# Patient Record
Sex: Female | Born: 1985 | Race: White | Hispanic: No | Marital: Single | Smoking: Former smoker
Health system: Southern US, Community
[De-identification: ages and names within clinical notes are randomized; demographics above are authoritative.]

## PROBLEM LIST (undated history)

## (undated) DIAGNOSIS — J45909 Unspecified asthma, uncomplicated: Secondary | ICD-10-CM

## (undated) DIAGNOSIS — R87629 Unspecified abnormal cytological findings in specimens from vagina: Secondary | ICD-10-CM

## (undated) DIAGNOSIS — L509 Urticaria, unspecified: Secondary | ICD-10-CM

## (undated) DIAGNOSIS — B009 Herpesviral infection, unspecified: Secondary | ICD-10-CM

## (undated) HISTORY — PX: LEEP: SHX91

## (undated) HISTORY — DX: Unspecified asthma, uncomplicated: J45.909

## (undated) HISTORY — DX: Urticaria, unspecified: L50.9

---
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
## 2008-10-26 ENCOUNTER — Ambulatory Visit (HOSPITAL_COMMUNITY): Admission: RE | Admit: 2008-10-26 | Discharge: 2008-10-26 | Payer: Self-pay | Admitting: Obstetrics and Gynecology

## 2008-10-26 ENCOUNTER — Encounter (INDEPENDENT_AMBULATORY_CARE_PROVIDER_SITE_OTHER): Payer: Self-pay | Admitting: Obstetrics and Gynecology

## 2009-06-01 ENCOUNTER — Inpatient Hospital Stay (HOSPITAL_COMMUNITY): Admission: AD | Admit: 2009-06-01 | Discharge: 2009-06-01 | Payer: Self-pay | Admitting: Obstetrics & Gynecology

## 2010-11-13 LAB — GC/CHLAMYDIA PROBE AMP, GENITAL
Chlamydia, DNA Probe: POSITIVE — AB
GC Probe Amp, Genital: NEGATIVE

## 2010-11-20 LAB — PREGNANCY, URINE: Preg Test, Ur: NEGATIVE

## 2010-11-20 LAB — CBC
HCT: 36.3 % (ref 36.0–46.0)
Hemoglobin: 12.5 g/dL (ref 12.0–15.0)
WBC: 6.6 10*3/uL (ref 4.0–10.5)

## 2010-11-20 LAB — URINALYSIS, ROUTINE W REFLEX MICROSCOPIC
Nitrite: NEGATIVE
Protein, ur: NEGATIVE mg/dL
Specific Gravity, Urine: >1.03 — ABNORMAL HIGH (ref 1.005–1.030)
Urobilinogen, UA: 0.2 mg/dL (ref 0.0–1.0)

## 2010-11-20 LAB — APTT: aPTT: 32 seconds (ref 24–37)

## 2010-11-20 LAB — PROTIME-INR: INR: 1.1 (ref 0.00–1.49)

## 2010-12-23 NOTE — Op Note (Signed)
NAME:  Alicia Murphy, Alicia Murphy                ACCOUNT NO.:  [id number]   MEDICAL RECORD NO.:  [id number]          PATIENT TYPE:  AMB   LOCATION:  SDC                           FACILITY:  [facility]   PHYSICIAN:  Miguel Aschoff, M.D.       DATE OF BIRTH:  10/24/1985   DATE OF PROCEDURE:  10/26/2008  DATE OF DISCHARGE:                               OPERATIVE REPORT   PREOPERATIVE DIAGNOSIS:  High-grade intraepithelial cervical neoplasia.   POSTOPERATIVE DIAGNOSIS:  High-grade intraepithelial cervical neoplasia.   PROCEDURE:  Cone biopsy endocervical curettage.   SURGEON:  Miguel Aschoff, MD   ANESTHESIA:  General.   COMPLICATIONS:  None.   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  The patient underwent colposcopy  with colposcopic-directed biopsies, the biopsy report came back  revealing high-grade cervical intraepithelial neoplasia with extension  into the endocervical glands.  Because of this, she is being taken to  the operating room at this time to undergo a cone biopsy in an effort to  excise the lesion to ensure that a more significant lesion does not  exist.  The risks and benefits of the procedure were discussed with the  patient.  Informed consent has been obtained.   PROCEDURE:  The patient was taken to the operating room, placed in  supine position.  IV sedation was administered without difficulty.  She  was then placed in dorsal lithotomy position, prepped and draped in the  usual sterile fashion.  After this was done, weighted speculum was  placed in the vaginal vault.  The anterior cervical lip was grasped with  a tenaculum and at this point 2 sutures of 0-chromic were placed from  the 2 o'clock to 4 o'clock positions and the 8 o'clock to 10 o'clock  positions in an effort to occlude the descending branch of cervical  artery.  These were tied and held.  After this was done, the cervix was  stained with Lugol solution and  obvious area with poor Lugol uptake was  identified.  Once this was done, the cervix was injected with a total of  14 mL of 1% Xylocaine with epinephrine 1:100,000.  Following this, a  cone of tissue was cut excising all areas of poor Lugol uptake and  ensuring that there was a margin about the lesion.  The lesion was then  cut free by cutting to the endocervical canal.  The cone of tissue was  removed intact at 12 o'clock position with a chromic suture.  The  residual portion of the endocervical canal was then curetted with  debulking curette.  This was sent as separate specimen.  Following this  the bed of the cone biopsy set was cauterized with electrocautery.  With  good hemostasis being achieved, a Gelfoam pack was placed into the  defect and the previous placed sutures were tied across the cervix to  hold Gelfoam in place.  The estimated blood loss from the procedure was  approximately 5 mL.  The patient tolerated the procedure well and went  to the recovery room in satisfactory condition.   Plan is for the patient to be discharged home.   Medications for home include Darvocet N 100 one every 4 hours as needed  for pain, doxycycline 1 twice a day x3 days.   The patient is instructed to place nothing in the vagina for 4 weeks, to  call for any problems such as fever, pain, or heavy bleeding.  She will  be seen back in 4 weeks for followup examination.      Miguel Aschoff, M.D.  Electronically Signed     AR/MEDQ  D:  10/26/2008  T:  10/26/2008  Job:  [id number]

## 2012-10-27 ENCOUNTER — Other Ambulatory Visit: Payer: Self-pay | Admitting: Obstetrics and Gynecology

## 2014-10-17 ENCOUNTER — Other Ambulatory Visit: Payer: Self-pay | Admitting: Obstetrics and Gynecology

## 2014-10-18 LAB — CYTOLOGY - PAP

## 2016-01-14 ENCOUNTER — Other Ambulatory Visit: Payer: Self-pay | Admitting: Obstetrics & Gynecology

## 2016-01-15 LAB — CYTOLOGY - PAP

## 2016-02-10 LAB — OB RESULTS CONSOLE HIV ANTIBODY (ROUTINE TESTING): HIV: NONREACTIVE

## 2016-02-10 LAB — OB RESULTS CONSOLE GC/CHLAMYDIA
CHLAMYDIA, DNA PROBE: NEGATIVE
GC PROBE AMP, GENITAL: NEGATIVE

## 2016-02-10 LAB — OB RESULTS CONSOLE HEPATITIS B SURFACE ANTIGEN: HEP B S AG: NEGATIVE

## 2016-02-10 LAB — OB RESULTS CONSOLE ABO/RH: RH Type: POSITIVE

## 2016-02-10 LAB — OB RESULTS CONSOLE RUBELLA ANTIBODY, IGM: RUBELLA: IMMUNE

## 2016-02-10 LAB — OB RESULTS CONSOLE ANTIBODY SCREEN: ANTIBODY SCREEN: NEGATIVE

## 2016-02-10 LAB — OB RESULTS CONSOLE RPR: RPR: NONREACTIVE

## 2016-07-27 ENCOUNTER — Other Ambulatory Visit (HOSPITAL_COMMUNITY): Payer: Self-pay | Admitting: Obstetrics and Gynecology

## 2016-07-27 DIAGNOSIS — O283 Abnormal ultrasonic finding on antenatal screening of mother: Secondary | ICD-10-CM

## 2016-07-27 DIAGNOSIS — Z3A36 36 weeks gestation of pregnancy: Secondary | ICD-10-CM

## 2016-07-27 DIAGNOSIS — N1330 Unspecified hydronephrosis: Secondary | ICD-10-CM

## 2016-07-28 ENCOUNTER — Encounter (HOSPITAL_COMMUNITY): Payer: Self-pay | Admitting: *Deleted

## 2016-07-28 ENCOUNTER — Ambulatory Visit (HOSPITAL_COMMUNITY)
Admission: RE | Admit: 2016-07-28 | Discharge: 2016-07-28 | Disposition: A | Discharge status: Home / Self Care | Payer: Self-pay | Source: Ambulatory Visit | Attending: Obstetrics and Gynecology | Admitting: Obstetrics and Gynecology

## 2016-07-28 DIAGNOSIS — O283 Abnormal ultrasonic finding on antenatal screening of mother: Secondary | ICD-10-CM

## 2016-07-28 DIAGNOSIS — Z3A36 36 weeks gestation of pregnancy: Secondary | ICD-10-CM | POA: Insufficient documentation

## 2016-07-28 DIAGNOSIS — N1330 Unspecified hydronephrosis: Secondary | ICD-10-CM

## 2016-07-28 DIAGNOSIS — O358XX0 Maternal care for other (suspected) fetal abnormality and damage, not applicable or unspecified: Secondary | ICD-10-CM | POA: Insufficient documentation

## 2016-07-28 DIAGNOSIS — Z363 Encounter for antenatal screening for malformations: Secondary | ICD-10-CM | POA: Insufficient documentation

## 2016-07-28 HISTORY — DX: Herpesviral infection, unspecified: B00.9

## 2016-07-28 HISTORY — DX: Unspecified abnormal cytological findings in specimens from vagina: R87.629

## 2016-08-10 NOTE — L&D Delivery Note (Addendum)
Delivery Note At 8:19 PM a viable and healthy female was delivered via  (Presentation: Left Occiput; Anterior ).  APGAR: 8, 9; weight 10lb, 2.8oz .  The anterior shoulder delivered easily after the head was delivered. Placenta status: Spontaneous ,Intact .  Cord: 3V  with a loose nuchal x 1  Anesthesia:  Epidural Episiotomy:  None Lacerations:  None Suture Repair: NA Est. Blood Loss (mL):  350 cc  Mom to postpartum.  Baby to Couplet care / Skin to Skin.  Alicia Murphy H. 08/20/2016, 8:55 PM

## 2016-08-14 ENCOUNTER — Other Ambulatory Visit (HOSPITAL_COMMUNITY): Payer: Self-pay

## 2016-08-14 ENCOUNTER — Encounter (HOSPITAL_COMMUNITY): Payer: Self-pay

## 2016-08-19 ENCOUNTER — Encounter (HOSPITAL_COMMUNITY): Payer: Self-pay | Admitting: *Deleted

## 2016-08-19 ENCOUNTER — Inpatient Hospital Stay (HOSPITAL_COMMUNITY): Payer: BLUE CROSS/BLUE SHIELD | Admitting: Anesthesiology

## 2016-08-19 ENCOUNTER — Inpatient Hospital Stay (HOSPITAL_COMMUNITY)
Admission: AD | Admit: 2016-08-19 | Discharge: 2016-08-22 | DRG: 774 | Disposition: A | Discharge status: Home / Self Care | Payer: BLUE CROSS/BLUE SHIELD | Source: Ambulatory Visit | Attending: Obstetrics and Gynecology | Admitting: Obstetrics and Gynecology

## 2016-08-19 DIAGNOSIS — O1493 Unspecified pre-eclampsia, third trimester: Secondary | ICD-10-CM | POA: Diagnosis present

## 2016-08-19 DIAGNOSIS — Z3A39 39 weeks gestation of pregnancy: Secondary | ICD-10-CM

## 2016-08-19 DIAGNOSIS — O1494 Unspecified pre-eclampsia, complicating childbirth: Secondary | ICD-10-CM | POA: Diagnosis present

## 2016-08-19 DIAGNOSIS — A6000 Herpesviral infection of urogenital system, unspecified: Secondary | ICD-10-CM | POA: Diagnosis present

## 2016-08-19 DIAGNOSIS — O134 Gestational [pregnancy-induced] hypertension without significant proteinuria, complicating childbirth: Principal | ICD-10-CM | POA: Diagnosis present

## 2016-08-19 DIAGNOSIS — O9832 Other infections with a predominantly sexual mode of transmission complicating childbirth: Secondary | ICD-10-CM | POA: Diagnosis present

## 2016-08-19 LAB — URINALYSIS, ROUTINE W REFLEX MICROSCOPIC
Bilirubin Urine: NEGATIVE
Glucose, UA: NEGATIVE mg/dL
Hgb urine dipstick: NEGATIVE
Ketones, ur: NEGATIVE mg/dL
Nitrite: NEGATIVE
PH: 5 (ref 5.0–8.0)
Protein, ur: 100 mg/dL — AB
Specific Gravity, Urine: 1.024 (ref 1.005–1.030)

## 2016-08-19 LAB — CBC
HEMATOCRIT: 35.1 % — AB (ref 36.0–46.0)
HEMATOCRIT: 36.2 % (ref 36.0–46.0)
Hemoglobin: 12.2 g/dL (ref 12.0–15.0)
Hemoglobin: 12.3 g/dL (ref 12.0–15.0)
MCH: 33.1 pg (ref 26.0–34.0)
MCH: 32.7 pg (ref 26.0–34.0)
MCHC: 34.8 g/dL (ref 30.0–36.0)
MCHC: 34 g/dL (ref 30.0–36.0)
MCV: 95.1 fL (ref 78.0–100.0)
MCV: 96.3 fL (ref 78.0–100.0)
PLATELETS: 135 10*3/uL — AB (ref 150–400)
Platelets: 132 10*3/uL — ABNORMAL LOW (ref 150–400)
RBC: 3.69 MIL/uL — AB (ref 3.87–5.11)
RBC: 3.76 MIL/uL — AB (ref 3.87–5.11)
RDW: 13.9 % (ref 11.5–15.5)
RDW: 14.1 % (ref 11.5–15.5)
WBC: 8.2 10*3/uL (ref 4.0–10.5)
WBC: 8.1 10*3/uL (ref 4.0–10.5)

## 2016-08-19 LAB — COMPREHENSIVE METABOLIC PANEL
ALT: 12 U/L — AB (ref 14–54)
ANION GAP: 7 (ref 5–15)
AST: 19 U/L (ref 15–41)
Albumin: 2.9 g/dL — ABNORMAL LOW (ref 3.5–5.0)
Alkaline Phosphatase: 122 U/L (ref 38–126)
BILIRUBIN TOTAL: 0.3 mg/dL (ref 0.3–1.2)
BUN: 15 mg/dL (ref 6–20)
CO2: 21 mmol/L — AB (ref 22–32)
CREATININE: 0.8 mg/dL (ref 0.44–1.00)
Calcium: 8.9 mg/dL (ref 8.9–10.3)
Chloride: 108 mmol/L (ref 101–111)
GFR calc non Af Amer: >60 mL/min (ref >60)
GLUCOSE: 86 mg/dL (ref 65–99)
Potassium: 3.9 mmol/L (ref 3.5–5.1)
SODIUM: 136 mmol/L (ref 135–145)
TOTAL PROTEIN: 6.5 g/dL (ref 6.5–8.1)

## 2016-08-19 LAB — PROTEIN / CREATININE RATIO, URINE
Creatinine, Urine: 171 mg/dL
Protein Creatinine Ratio: 0.37 mg/mg{Cre} — ABNORMAL HIGH (ref 0.00–0.15)
Total Protein, Urine: 63 mg/dL

## 2016-08-19 LAB — OB RESULTS CONSOLE GBS: GBS: NEGATIVE

## 2016-08-19 LAB — TYPE AND SCREEN
ABO/RH(D): O POS
Antibody Screen: NEGATIVE

## 2016-08-19 MED ORDER — PHENYLEPHRINE 40 MCG/ML (10ML) SYRINGE FOR IV PUSH (FOR BLOOD PRESSURE SUPPORT)
80.0000 ug | PREFILLED_SYRINGE | INTRAVENOUS | Status: DC | PRN
  Filled 2016-08-19: qty 10

## 2016-08-19 MED ORDER — LACTATED RINGERS IV SOLN
INTRAVENOUS | Status: DC
  Administered 2016-08-19 – 2016-08-20 (×2): via INTRAVENOUS

## 2016-08-19 MED ORDER — SOD CITRATE-CITRIC ACID 500-334 MG/5ML PO SOLN: 30.0000 mL | ORAL | Status: DC | PRN

## 2016-08-19 MED ORDER — FAMOTIDINE 20 MG PO TABS
20.0000 mg | ORAL_TABLET | Freq: Two times a day (BID) | ORAL | Status: DC | PRN
  Administered 2016-08-19 – 2016-08-20 (×3): 20 mg via ORAL
  Filled 2016-08-19 (×3): qty 1

## 2016-08-19 MED ORDER — OXYTOCIN 40 UNITS IN LACTATED RINGERS INFUSION - SIMPLE MED
2.5000 [IU]/h | INTRAVENOUS | Status: DC
Start: 2016-08-19 — End: 2016-08-20
  Administered 2016-08-20: 2.5 [IU]/h via INTRAVENOUS
  Filled 2016-08-19: qty 1000

## 2016-08-19 MED ORDER — LACTATED RINGERS IV SOLN: 500.0000 mL | INTRAVENOUS | Status: DC | PRN

## 2016-08-19 MED ORDER — EPHEDRINE 5 MG/ML INJ: 10.0000 mg | INTRAVENOUS | Status: DC | PRN

## 2016-08-19 MED ORDER — OXYTOCIN BOLUS FROM INFUSION
500.0000 mL | Freq: Once | INTRAVENOUS | Status: AC
  Administered 2016-08-20: 500 mL via INTRAVENOUS

## 2016-08-19 MED ORDER — ACETAMINOPHEN 325 MG PO TABS: 650.0000 mg | ORAL_TABLET | ORAL | Status: DC | PRN

## 2016-08-19 MED ORDER — LIDOCAINE HCL (PF) 1 % IJ SOLN
INTRAMUSCULAR | Status: DC | PRN
  Administered 2016-08-19 (×2): 5 mL

## 2016-08-19 MED ORDER — ONDANSETRON HCL 4 MG/2ML IJ SOLN
4.0000 mg | Freq: Four times a day (QID) | INTRAMUSCULAR | Status: DC | PRN
  Administered 2016-08-20: 4 mg via INTRAVENOUS
  Filled 2016-08-19: qty 2

## 2016-08-19 MED ORDER — LACTATED RINGERS IV SOLN: 500.0000 mL | Freq: Once | INTRAVENOUS | Status: DC

## 2016-08-19 MED ORDER — OXYTOCIN 40 UNITS IN LACTATED RINGERS INFUSION - SIMPLE MED
1.0000 m[IU]/min | INTRAVENOUS | Status: DC
  Administered 2016-08-19: 2 m[IU]/min via INTRAVENOUS
  Filled 2016-08-19: qty 1000

## 2016-08-19 MED ORDER — PHENYLEPHRINE 40 MCG/ML (10ML) SYRINGE FOR IV PUSH (FOR BLOOD PRESSURE SUPPORT): 80.0000 ug | PREFILLED_SYRINGE | INTRAVENOUS | Status: DC | PRN

## 2016-08-19 MED ORDER — TERBUTALINE SULFATE 1 MG/ML IJ SOLN: 0.2500 mg | Freq: Once | INTRAMUSCULAR | Status: DC | PRN

## 2016-08-19 MED ORDER — FENTANYL 2.5 MCG/ML BUPIVACAINE 1/10 % EPIDURAL INFUSION (WH - ANES)
14.0000 mL/h | INTRAMUSCULAR | Status: DC | PRN
  Administered 2016-08-19 – 2016-08-20 (×4): 14 mL/h via EPIDURAL
  Filled 2016-08-19 (×4): qty 100

## 2016-08-19 MED ORDER — OXYCODONE-ACETAMINOPHEN 5-325 MG PO TABS: 1.0000 | ORAL_TABLET | ORAL | Status: DC | PRN

## 2016-08-19 MED ORDER — DIPHENHYDRAMINE HCL 50 MG/ML IJ SOLN: 12.5000 mg | INTRAMUSCULAR | Status: DC | PRN

## 2016-08-19 MED ORDER — OXYCODONE-ACETAMINOPHEN 5-325 MG PO TABS: 2.0000 | ORAL_TABLET | ORAL | Status: DC | PRN

## 2016-08-19 MED ORDER — LIDOCAINE HCL (PF) 1 % IJ SOLN
30.0000 mL | INTRAMUSCULAR | Status: DC | PRN
  Filled 2016-08-19: qty 30

## 2016-08-19 NOTE — MAU Provider Note (Signed)
Chief Complaint:  Hypertension   First Provider Initiated Contact with Patient 08/19/16 1804      HPI: Alicia Murphy is a 31 y.o. G1P0 at 56w2dwho presents to maternity admissions sent from the office for elevated BP for the first time today.  She denies h/a or visual disturbances but does report bilateral rib pain in her upper abdomen x 1 week. She has not tried any treatments for BP or pain.  Her pain is intermittent and unchanged since onset.  She is taking her Valtrex as prescribed for hx of genital herpes and denies any prodromal symptoms or signs of active outbreak. She reports good fetal movement, denies regular contractions, LOF, vaginal bleeding, vaginal itching/burning, urinary symptoms, dizziness, n/v, or fever/chills.    HPI  Past Medical History: Past Medical History:  Diagnosis Date  . HSV infection   . Vaginal Pap smear, abnormal     Past obstetric history: OB History  Gravida Para Term Preterm AB Living  1         0  SAB TAB Ectopic Multiple Live Births               # Outcome Date GA Lbr Len/2nd Weight Sex Delivery Anes PTL Lv  1 Current               Past Surgical History: Past Surgical History:  Procedure Laterality Date  . LEEP      Family History: History reviewed. No pertinent family history.  Social History: Social History  Substance Use Topics  . Smoking status: Never Smoker  . Smokeless tobacco: Never Used  . Alcohol use No    Allergies: No Known Allergies  Meds:  Prescriptions Prior to Admission  Medication Sig Dispense Refill Last Dose  . diphenhydrAMINE (BENADRYL) 25 mg capsule Take 25 mg by mouth every 6 (six) hours as needed for sleep.    Past Month at Unknown time  . Prenatal Vit w/Fe-Methylfol-FA (PNV PO) Take 1 tablet by mouth every morning.    08/19/2016 at Unknown time  . ranitidine (ZANTAC) 150 MG capsule Take 150 mg by mouth 2 (two) times daily.   08/19/2016 at Unknown time  . Tetrahydrozoline HCl (VISINE OP) Apply 1 drop to eye  as needed (itchy eyes).   08/18/2016 at Unknown time  . valACYclovir (VALTREX) 500 MG tablet Take 500 mg by mouth 2 (two) times daily.   08/19/2016 at Unknown time    ROS:  Review of Systems  Constitutional: Negative for chills, fatigue and fever.  Eyes: Negative for visual disturbance.  Respiratory: Negative for shortness of breath.   Cardiovascular: Negative for chest pain.  Gastrointestinal: Negative for abdominal pain, nausea and vomiting.  Genitourinary: Negative for difficulty urinating, dysuria, flank pain, pelvic pain, vaginal bleeding, vaginal discharge and vaginal pain.  Neurological: Negative for dizziness and headaches.  Psychiatric/Behavioral: Negative.      I have reviewed patient's Past Medical Hx, Surgical Hx, Family Hx, Social Hx, medications and allergies.   Physical Exam   Patient Vitals for the past 24 hrs:  BP Temp Temp src Pulse Resp  08/19/16 1817 147/86 - - 77 -  08/19/16 1804 145/89 - - 82 -  08/19/16 1747 151/89 - - 78 -  08/19/16 1742 147/99 - - 89 -  08/19/16 1705 142/99 98.1 F (36.7 C) Oral 87 18   Constitutional: Well-developed, well-nourished female in no acute distress.  HEART: normal rate, heart sounds, regular rhythm RESP: normal effort, lung sounds clear and equal bilaterally  GI: Abd soft, non-tender, gravid appropriate for gestational age.  MS: Extremities nontender, no edema, normal ROM Neurologic: Alert and oriented x 4.  GU: Neg CVAT.    2/90/-2, soft, anterior, vertex by Fatima Blank, CNM No clinical evidence of HSV lesions on SSE  FHT:  Baseline 140 , moderate variability, accelerations present, no decelerations Contractions: None on toco or to palpation   Labs: Results for orders placed or performed during the hospital encounter of 08/19/16 (from the past 24 hour(s))  Protein / creatinine ratio, urine     Status: Abnormal   Collection Time: 08/19/16  5:08 PM  Result Value Ref Range   Creatinine, Urine 171.00 mg/dL    Total Protein, Urine 63 mg/dL   Protein Creatinine Ratio 0.37 (H) 0.00 - 0.15 mg/mg[Cre]  Urinalysis, Routine w reflex microscopic     Status: Abnormal   Collection Time: 08/19/16  5:08 PM  Result Value Ref Range   Color, Urine YELLOW YELLOW   APPearance CLOUDY (A) CLEAR   Specific Gravity, Urine 1.024 1.005 - 1.030   pH 5.0 5.0 - 8.0   Glucose, UA NEGATIVE NEGATIVE mg/dL   Hgb urine dipstick NEGATIVE NEGATIVE   Bilirubin Urine NEGATIVE NEGATIVE   Ketones, ur NEGATIVE NEGATIVE mg/dL   Protein, ur 100 (A) NEGATIVE mg/dL   Nitrite NEGATIVE NEGATIVE   Leukocytes, UA TRACE (A) NEGATIVE   RBC / HPF 0-5 0 - 5 RBC/hpf   WBC, UA 0-5 0 - 5 WBC/hpf   Bacteria, UA RARE (A) NONE SEEN   Squamous Epithelial / LPF TOO NUMEROUS TO COUNT (A) NONE SEEN   Mucous PRESENT   CBC     Status: Abnormal   Collection Time: 08/19/16  5:52 PM  Result Value Ref Range   WBC 8.2 4.0 - 10.5 K/uL   RBC 3.69 (L) 3.87 - 5.11 MIL/uL   Hemoglobin 12.2 12.0 - 15.0 g/dL   HCT 35.1 (L) 36.0 - 46.0 %   MCV 95.1 78.0 - 100.0 fL   MCH 33.1 26.0 - 34.0 pg   MCHC 34.8 30.0 - 36.0 g/dL   RDW 13.9 11.5 - 15.5 %   Platelets 132 (L) 150 - 400 K/uL  Comprehensive metabolic panel     Status: Abnormal   Collection Time: 08/19/16  5:52 PM  Result Value Ref Range   Sodium 136 135 - 145 mmol/L   Potassium 3.9 3.5 - 5.1 mmol/L   Chloride 108 101 - 111 mmol/L   CO2 21 (L) 22 - 32 mmol/L   Glucose, Bld 86 65 - 99 mg/dL   BUN 15 6 - 20 mg/dL   Creatinine, Ser 0.80 0.44 - 1.00 mg/dL   Calcium 8.9 8.9 - 10.3 mg/dL   Total Protein 6.5 6.5 - 8.1 g/dL   Albumin 2.9 (L) 3.5 - 5.0 g/dL   AST 19 15 - 41 U/L   ALT 12 (L) 14 - 54 U/L   Alkaline Phosphatase 122 38 - 126 U/L   Total Bilirubin 0.3 0.3 - 1.2 mg/dL   GFR calc non Af Amer >60 >60 mL/min   GFR calc Af Amer >60 >60 mL/min   Anion gap 7 5 - 15      Imaging:  [place] Mfm Ob Detail +14 Wk  Result Date: 07/28/2016 OBSTETRICAL ULTRASOUND: This exam was performed within a [facility] Ultrasound Department. The OB US report was generated in the AS system, and faxed to the ordering physician.  This report is available in the [facility]  PACS. See the AS Obstetric US report via the Image Link.   MAU Course/MDM: I have ordered labs and reviewed results.  NST reviewed Consult Dr Rogue Bussing with presentation, exam findings and test results.  Proteinuria with consistently elevated BP c/w preeclampsia without severe features Plan IOL for preeclampsia  Pt stable at time of transfer   Assessment: 1. Preeclampsia, third trimester   2. GBS negative 3.  Hx genital HSV, on Valtrex, no evidence of active outbreak  Plan: Admit to Spiro to start at 2 milliunits/min, increase by 2 per protocol Pt may have epidural when desired Anticipate NSVD   Fatima Blank Certified Nurse-Midwife 08/19/2016 7:11 PM

## 2016-08-19 NOTE — Anesthesia Preprocedure Evaluation (Signed)

## 2016-08-19 NOTE — H&P (Signed)
31 y.o. [redacted]w[redacted]d  G1P0 comes in from office with mild range BPs and +1 protein in her urine, otherwise asymptomatic and no prior h/o elevated BPs.  Otherwise has good fetal movement and no bleeding.  Denies any current  HSV symptoms.  Past Medical History:  Diagnosis Date  . HSV infection   . Vaginal Pap smear, abnormal    LEEP    Past Surgical History:  Procedure Laterality Date  . LEEP    . oral surgery  2003   [place] teeth extraction     OB History  Gravida Para Term Preterm AB Living  1         0  SAB TAB Ectopic Multiple Live Births               # Outcome Date GA Lbr Len/2nd Weight Sex Delivery Anes PTL Lv  1 Current               Social History   Social History  . Marital status: Single    Spouse name: N/A  . Number of children: N/A  . Years of education: N/A   Occupational History  . Not on file.   Social History Main Topics  . Smoking status: Never Smoker  . Smokeless tobacco: Never Used  . Alcohol use No  . Drug use: No  . Sexual activity: Yes    Birth control/ protection: None   Other Topics Concern  . Not on file   Social History Narrative  . No narrative on file   Patient has no known allergies.    Prenatal Transfer Tool  Maternal Diabetes: No Genetic Screening: Normal Maternal Ultrasounds/Referrals: Abnormal:  Findings:   Isolated EIF (echogenic intracardiac focus), Fetal renal pyelectasis Fetal Ultrasounds or other Referrals:  None Maternal Substance Abuse:  No Significant Maternal Medications:  Valtrex for HSV treatment and prophylaxis Significant Maternal Lab Results: None  Other PNC: HSV    Vitals:   08/19/16 2103 08/19/16 2134  BP: (!) 143/91 (!) 159/85  Pulse: 74 85  Resp: 18   Temp:      Exam per MAU Lungs/Cor:  NAD Abdomen:  soft, gravid Ex:  no cords, erythema SVE:  2/90/-2, her HSV lesions  FHTs:  125, good STV, NST R Toco:  irreg at admission   A/P   Admit for IOL for preelampsia  LFTs normal, Plt 135, Pr:Cr 0.37,  Cr 0.80  Epidural when desired  Pit 2x2  GBS Neg  No HSV lesions per nurse midwife eval  Allyn Kenner

## 2016-08-19 NOTE — Anesthesia Pain Management Evaluation Note (Signed)
  CRNA Pain Management Visit Note  Patient: Alicia Murphy, 31 y.o., female  "Hello I am a member of the anesthesia team at [facility]. We have an anesthesia team available at all times to provide care throughout the hospital, including epidural management and anesthesia for C-section. I don't know your plan for the delivery whether it a natural birth, water birth, IV sedation, nitrous supplementation, doula or epidural, but we want to meet your pain goals."   1.Was your pain managed to your expectations on prior hospitalizations?   No prior hospitalizations  2.What is your expectation for pain management during this hospitalization?     Epidural, IV pain meds and Nitrous Oxide  3.How can we help you reach that goal? Be available  Record the patient's initial score and the patient's pain goal.   Pain: 1  Pain Goal: 5 [facility] wants you to be able to say your pain was always managed very well.  [facility] 08/19/2016

## 2016-08-19 NOTE — MAU Note (Signed)
Pt sent from MD office, BP was elevated, also proteinuria.  Pt denies HA or visual changes, states her ribs have been hurting lately.  Has some braxton hicks contractions, denies bleeding or LOF.

## 2016-08-19 NOTE — Anesthesia Procedure Notes (Signed)
Epidural Patient location during procedure: OB  Staffing Anesthesiologist: Montez Hageman Performed: anesthesiologist   Preanesthetic Checklist Completed: patient identified, site marked, surgical consent, pre-op evaluation, timeout performed, IV checked, risks and benefits discussed and monitors and equipment checked  Epidural Patient position: sitting Prep: DuraPrep Patient monitoring: heart rate, continuous pulse ox and blood pressure Approach: midline Location: L3-L4 Injection technique: LOR saline  Needle:  Needle type: Tuohy  Needle gauge: 17 G Needle length: 9 cm and 9 Needle insertion depth: 5 cm Catheter type: closed end flexible Catheter size: 20 Guage Catheter at skin depth: 9 cm Test dose: negative  Assessment Events: blood not aspirated, injection not painful, no injection resistance, negative IV test and no paresthesia  Additional Notes Patient identified. Risks/Benefits/Options discussed with patient including but not limited to bleeding, infection, nerve damage, paralysis, failed block, incomplete pain control, headache, blood pressure changes, nausea, vomiting, reactions to medication both or allergic, itching and postpartum back pain. Confirmed with bedside nurse the patient's most recent platelet count. Confirmed with patient that they are not currently taking any anticoagulation, have any bleeding history or any family history of bleeding disorders. Patient expressed understanding and wished to proceed. All questions were answered. Sterile technique was used throughout the entire procedure. Please see nursing notes for vital signs. Test dose was given through epidural needle and negative prior to continuing to dose epidural or start infusion. Warning signs of high block given to the patient including shortness of breath, tingling/numbness in hands, complete motor block, or any concerning symptoms with instructions to call for help. Patient was given instructions  on fall risk and not to get out of bed. All questions and concerns addressed with instructions to call with any issues.

## 2016-08-20 ENCOUNTER — Encounter (HOSPITAL_COMMUNITY): Payer: Self-pay

## 2016-08-20 LAB — RPR: RPR: NONREACTIVE

## 2016-08-20 LAB — ABO/RH: ABO/RH(D): O POS

## 2016-08-20 MED ORDER — ONDANSETRON HCL 4 MG/2ML IJ SOLN: 4.0000 mg | INTRAMUSCULAR | Status: DC | PRN

## 2016-08-20 MED ORDER — METHYLERGONOVINE MALEATE 0.2 MG PO TABS: 0.2000 mg | ORAL_TABLET | ORAL | Status: DC | PRN

## 2016-08-20 MED ORDER — SENNOSIDES-DOCUSATE SODIUM 8.6-50 MG PO TABS
2.0000 | ORAL_TABLET | ORAL | Status: DC
  Administered 2016-08-21 (×2): 2 via ORAL
  Filled 2016-08-20 (×2): qty 2

## 2016-08-20 MED ORDER — WITCH HAZEL-GLYCERIN EX PADS: 1.0000 "application " | MEDICATED_PAD | CUTANEOUS | Status: DC | PRN

## 2016-08-20 MED ORDER — ZOLPIDEM TARTRATE 5 MG PO TABS
5.0000 mg | ORAL_TABLET | Freq: Every evening | ORAL | Status: DC | PRN
Start: 2016-08-20 — End: 2016-08-22

## 2016-08-20 MED ORDER — IBUPROFEN 600 MG PO TABS
600.0000 mg | ORAL_TABLET | Freq: Four times a day (QID) | ORAL | Status: DC
  Administered 2016-08-21 – 2016-08-22 (×7): 600 mg via ORAL
  Filled 2016-08-20 (×7): qty 1

## 2016-08-20 MED ORDER — ACETAMINOPHEN 325 MG PO TABS: 650.0000 mg | ORAL_TABLET | ORAL | Status: DC | PRN

## 2016-08-20 MED ORDER — COCONUT OIL OIL
1.0000 "application " | TOPICAL_OIL | Status: DC | PRN
  Administered 2016-08-22: 1 via TOPICAL
  Filled 2016-08-20: qty 120

## 2016-08-20 MED ORDER — PRENATAL MULTIVITAMIN CH
1.0000 | ORAL_TABLET | Freq: Every day | ORAL | Status: DC
  Administered 2016-08-21 – 2016-08-22 (×2): 1 via ORAL
  Filled 2016-08-20 (×2): qty 1

## 2016-08-20 MED ORDER — OXYCODONE-ACETAMINOPHEN 5-325 MG PO TABS
1.0000 | ORAL_TABLET | ORAL | Status: DC | PRN
  Administered 2016-08-21 – 2016-08-22 (×2): 1 via ORAL

## 2016-08-20 MED ORDER — BENZOCAINE-MENTHOL 20-0.5 % EX AERO
1.0000 "application " | INHALATION_SPRAY | CUTANEOUS | Status: DC | PRN
  Administered 2016-08-21: 1 via TOPICAL
  Filled 2016-08-20: qty 56

## 2016-08-20 MED ORDER — OXYCODONE-ACETAMINOPHEN 5-325 MG PO TABS
2.0000 | ORAL_TABLET | ORAL | Status: DC | PRN
  Administered 2016-08-21: 2 via ORAL
  Filled 2016-08-20 (×3): qty 2

## 2016-08-20 MED ORDER — METHYLERGONOVINE MALEATE 0.2 MG/ML IJ SOLN: 0.2000 mg | INTRAMUSCULAR | Status: DC | PRN

## 2016-08-20 MED ORDER — TETANUS-DIPHTH-ACELL PERTUSSIS 5-2.5-18.5 LF-MCG/0.5 IM SUSP
0.5000 mL | Freq: Once | INTRAMUSCULAR | Status: DC
  Filled 2016-08-20: qty 0.5

## 2016-08-20 MED ORDER — ONDANSETRON HCL 4 MG PO TABS: 4.0000 mg | ORAL_TABLET | ORAL | Status: DC | PRN

## 2016-08-20 MED ORDER — SIMETHICONE 80 MG PO CHEW: 80.0000 mg | CHEWABLE_TABLET | ORAL | Status: DC | PRN

## 2016-08-20 MED ORDER — DIPHENHYDRAMINE HCL 25 MG PO CAPS: 25.0000 mg | ORAL_CAPSULE | Freq: Four times a day (QID) | ORAL | Status: DC | PRN

## 2016-08-20 MED ORDER — DIBUCAINE 1 % RE OINT: 1.0000 "application " | TOPICAL_OINTMENT | RECTAL | Status: DC | PRN

## 2016-08-21 LAB — CBC
HEMATOCRIT: 25.5 % — AB (ref 36.0–46.0)
Hemoglobin: 9 g/dL — ABNORMAL LOW (ref 12.0–15.0)
MCH: 33.3 pg (ref 26.0–34.0)
MCHC: 35.3 g/dL (ref 30.0–36.0)
MCV: 94.4 fL (ref 78.0–100.0)
Platelets: 128 10*3/uL — ABNORMAL LOW (ref 150–400)
RBC: 2.7 MIL/uL — ABNORMAL LOW (ref 3.87–5.11)
RDW: 14.1 % (ref 11.5–15.5)
WBC: 14 10*3/uL — AB (ref 4.0–10.5)

## 2016-08-21 NOTE — Progress Notes (Signed)
Post Partum Day 1 Subjective: no complaints, up ad lib, voiding, tolerating PO, + flatus and having some difficulty breast feeding  Objective: Blood pressure 130/87, pulse 80, temperature 97.9 F (36.6 C), temperature source Oral, resp. rate 18, height 5\' 6"  (1.676 m), weight 83.5 kg (184 lb), last menstrual period 11/18/2015, SpO2 98 %, unknown if currently breastfeeding.  Physical Exam:  General: alert, cooperative and no distress Lochia: appropriate Uterine Fundus: firm Perineum: intact DVT Evaluation: No evidence of DVT seen on physical exam. Negative Homan's sign. No cords or calf tenderness. No significant calf/ankle edema.   Recent Labs  08/19/16 1752 08/21/16 0524  HGB 12.3  12.2 9.0*  HCT 36.2  35.1* 25.5*    Assessment/Plan: Plan for discharge tomorrow, Breastfeeding, Lactation consult and Circumcision prior to discharge   LOS: 2 days   [place], [place] 08/21/2016, 9:07 AM

## 2016-08-21 NOTE — Progress Notes (Signed)
CSW received consult for hx of Anxiety.  CSW completed chart review and notes that there is no documentation of this dx in [facility] H&P, or RN admission summary.  Therefore, CSW is screening out referral at this time.  Please call CSW if concerns arise or by MOB's request.

## 2016-08-21 NOTE — Lactation Note (Signed)
This note was copied from a baby's chart. Lactation Consultation Note  Patient Name: Alicia Murphy [id number] Date: 08/21/2016 Reason for consult: Initial assessment  Initial visit at 20 hours of life. Mom reports + breast changes w/pregnancy.   "Alicia Murphy" has been sleepy & not interested in eating. Alicia Murphy was put to breast & latched for a few moments (using the "teacup" hold), but fell asleep after a few suckles. Hand expression was taught to Mom & infant was spoon-fed a small amount of colostrum. Alicia Murphy did latch and suckled semi-continuously for more than 10 minutes during consult. Parents can identify sound of swallows. Parents know to keep trying. Dad was taught how to do breast compression to increase amount of time Alicia Murphy suckles at the breast.   Breastfeeding brochure provided; parents were made aware of our phone # for post-discharge questions.   Matthias Hughs [facility] 08/21/2016, 5:15 PM

## 2016-08-21 NOTE — Anesthesia Postprocedure Evaluation (Signed)
Anesthesia Post Note  Patient: Alicia Murphy  Procedure(s) Performed: * No procedures listed *  Patient location during evaluation: Mother Baby Anesthesia Type: Epidural Level of consciousness: awake and alert Pain management: pain level controlled Vital Signs Assessment: post-procedure vital signs reviewed and stable Respiratory status: spontaneous breathing Cardiovascular status: blood pressure returned to baseline Postop Assessment: no headache, patient able to bend at knees, no backache, no signs of nausea or vomiting, epidural receding and adequate PO intake Anesthetic complications: no        Last Vitals:  Vitals:   08/21/16 0020 08/21/16 0528  BP: 127/80 130/87  Pulse: 98 80  Resp: 20 18  Temp: 36.9 C 36.6 C    Last Pain:  Vitals:   08/21/16 0528  TempSrc: Oral  PainSc: 5    Pain Goal: Patients Stated Pain Goal: 4 (08/19/16 2103)               Birdena Crandall, [place]

## 2016-08-22 ENCOUNTER — Ambulatory Visit: Payer: Self-pay

## 2016-08-22 MED ORDER — BREAST PUMP MISC: 1.0000 [IU] | 0 refills | Status: DC | PRN

## 2016-08-22 MED ORDER — IBUPROFEN 600 MG PO TABS: 600.0000 mg | ORAL_TABLET | Freq: Four times a day (QID) | ORAL | 3 refills | Status: DC | PRN

## 2016-08-22 MED ORDER — OXYCODONE-ACETAMINOPHEN 5-325 MG PO TABS: 1.0000 | ORAL_TABLET | ORAL | 0 refills | Status: DC | PRN

## 2016-08-22 NOTE — Progress Notes (Signed)
Post Partum Day 2 Subjective: no complaints, up ad lib, voiding, tolerating PO, + flatus and breast milk hasn't come in  Objective: Blood pressure 129/61, pulse 75, temperature 97.8 F (36.6 C), temperature source Oral, resp. rate 18, height 5\' 6"  (1.676 m), weight 83.5 kg (184 lb), last menstrual period 11/18/2015, SpO2 98 %, unknown if currently breastfeeding.  Physical Exam:  General: alert, cooperative and no distress Lochia: appropriate Uterine Fundus: firm DVT Evaluation: No evidence of DVT seen on physical exam. Negative Homan's sign. No cords or calf tenderness. No significant calf/ankle edema.   Recent Labs  08/19/16 1752 08/21/16 0524  HGB 12.3  12.2 9.0*  HCT 36.2  35.1* 25.5*    Assessment/Plan: Will discharge patient, she will room with baby who has to stay for  Jaundice. F/u post partum visit in 4 weeks.    LOS: 3 days   Rolen Conger, [place] 08/22/2016, 9:50 AM

## 2016-08-22 NOTE — Discharge Summary (Signed)
Obstetric Discharge Summary Reason for Admission: induction of labor / gestational hypertension Prenatal Procedures: NST and ultrasound Intrapartum Procedures: spontaneous vaginal delivery Postpartum Procedures: none Complications-Operative and Postpartum: none Hemoglobin  Date Value Ref Range Status  08/21/2016 9.0 (L) 12.0 - 15.0 g/dL Final    Comment:    DELTA CHECK NOTED REPEATED TO VERIFY    HCT  Date Value Ref Range Status  08/21/2016 25.5 (L) 36.0 - 46.0 % Final    Physical Exam:  General: alert, cooperative and no distress Lochia: appropriate Uterine Fundus: firm DVT Evaluation: No evidence of DVT seen on physical exam. Negative Homan's sign. No cords or calf tenderness. No significant calf/ankle edema.  Discharge Diagnoses: Term Pregnancy-delivered  Discharge Information: Date: 08/22/2016 Activity: pelvic rest Diet: routine Medications: PNV, Ibuprofen and Percocet Condition: stable Instructions: refer to practice specific booklet Discharge to: home   Newborn Data: Live born female  Birth Weight: 10 lb 2.8 oz (4615 g) APGAR: 8, 9  Baby on bili lights  Alicia Murphy, [place] 08/22/2016, 9:55 AM

## 2016-08-22 NOTE — Lactation Note (Signed)
This note was copied from a baby's chart. Lactation Consultation Note  Patient Name: Alicia Murphy [id number] Date: 08/22/2016 Reason for consult: Follow-up assessment;Difficult latch;Breast/nipple pain Mom called for assist with latching baby to left breast. Nipple flattens with breast compression w/latch. Mom reports discomfort, baby falls asleep at breast, but with suck training on LC finger baby develops a nutritive suckling pattern so decided to try nipple shield to help with latch and comfort for Mom with baby at breast. Used 20 nipple shield then assisted FOB to set up and give supplement at breast using 5 fr feeding tube/syringe. Mom reported less discomfort using nipple shield. LC did not hear snap back or observe as much chewing when at breast using nipple shield. Scant amount of breast milk in nipple shield at end of feeding. Baby took 7 ml of supplement over 20 minutes.  Mom able to demonstrate how to apply nipple shield correctly. Hand out given and care of nipple shield discussed.  Advised to continue previous plan, BF with feeding ques 8-12 times or more in 24 hours. Supplement at breast using 5 fr feeding tube/syringe with EBM/formula according to guidelines per hours of age every 3 hours.  Mom to post pump every 3 hours for 15 minutes to encourage milk production and to have EBM to supplement. Apply EBM to sore nipples/coconut oil, or comfort gels with EBM.  Ask for assist as needed.    Maternal Data    Feeding Feeding Type: Formula Length of feed: 25 min  LATCH Score/Interventions Latch: Grasps breast easily, tongue down, lips flanged, rhythmical sucking. (using 20 nipple shield, left breast) Intervention(s): Adjust position;Assist with latch;Breast massage;Breast compression  Audible Swallowing: Spontaneous and intermittent (w/supplement at breast) Intervention(s): Skin to skin;Hand expression  Type of Nipple: Everted at rest and after stimulation (short shaft, flatten  w/breast compression)  Comfort (Breast/Nipple): Filling, red/small blisters or bruises, mild/mod discomfort  Problem noted: Mild/Moderate discomfort Interventions (Mild/moderate discomfort): Comfort gels;Hand expression  Hold (Positioning): Assistance needed to correctly position infant at breast and maintain latch. Intervention(s): Breastfeeding basics reviewed;Support Pillows;Position options;Skin to skin  LATCH Score: 8  Lactation Tools Discussed/Used Tools: Nipple Shields;Pump;37F feeding tube / Syringe;Comfort gels Nipple shield size: 20;24 Breast pump type: Double-Electric Breast Pump   Consult Status Consult Status: Follow-up Date: 08/23/16 Follow-up type: In-patient    [name] 08/22/2016, 3:59 PM

## 2016-08-22 NOTE — Lactation Note (Signed)
This note was copied from a baby's chart. Lactation Consultation Note  Patient Name: Alicia Murphy [id number] Date: 08/22/2016 Reason for consult: Follow-up assessment;Difficult latch Per MD, would like baby to be supplemented at breast, bili levels high/intermediate zone. Mom pumping but reports discomfort with pump, nipples red bilateral, no cracking or bleeding. Changed flange to size 27 and advised to apply small amount of coconut oil to nipple/aerola prior to pumping to see if this helps. With latching baby to right breast at this visit, took few attempts for baby to obtain good depth, Mom has discomfort with initial latch that continue during the feeding in spite of what appears to be deep latch. Occasional snap back noted/chewing with baby at breast. Demonstrated and assisted Mom to supplement at breast using 5 fr feeding tube/syringe. Used formula at this feeding, no breast-milk available with pumping yet. Baby took 10 ml spitting up small amount. Mom's nipple round when baby came off the breast. Baby had more nutritive suckling pattern with supplement.  Mom reports she has more difficulty latching baby to left breast. LC advised Mom to call with next feeding for assist. Advised to BF with feeding ques, 8-12 times or more in 24 hours. Supplement at breast according to guidelines per hours of age every 3 hours. Post pump for 15 minutes every 3 hours to encourage milk production and to have EBM to supplement.   Maternal Data    Feeding Feeding Type: Formula Length of feed: 20 min  LATCH Score/Interventions Latch: Repeated attempts needed to sustain latch, nipple held in mouth throughout feeding, stimulation needed to elicit sucking reflex. Intervention(s): Adjust position;Assist with latch;Breast massage;Breast compression  Audible Swallowing: Spontaneous and intermittent (w/supplement at breast) Intervention(s): Skin to skin;Hand expression  Type of Nipple: Everted at rest and after  stimulation (short nipple shafts bilateral)  Comfort (Breast/Nipple): Filling, red/small blisters or bruises, mild/mod discomfort  Problem noted: Mild/Moderate discomfort Interventions (Mild/moderate discomfort): Hand massage;Hand expression;Comfort gels;Post-pump  Hold (Positioning): Assistance needed to correctly position infant at breast and maintain latch. Intervention(s): Breastfeeding basics reviewed;Support Pillows;Position options;Skin to skin  LATCH Score: 7  Lactation Tools Discussed/Used Tools: Pump;3F feeding tube / Syringe;Comfort gels Breast pump type: Double-Electric Breast Pump   Consult Status Consult Status: Follow-up Date: 08/22/16 Follow-up type: In-patient    [name] 08/22/2016, 3:50 PM

## 2016-08-23 ENCOUNTER — Ambulatory Visit: Payer: Self-pay

## 2016-08-23 NOTE — Lactation Note (Signed)
This note was copied from a baby's chart. LLactation Consultation Note  Patient Name: Alicia Murphy [id number] Date: 08/23/2016  Lactation OP appointment made for Friday, 06/28/17 at 4:00. 2 week pump rental papers left for Mom to complete.   Maternal Data    Feeding Feeding Type: Formula Nipple Type: Slow - flow  LATCH Score/Interventions                      Lactation Tools Discussed/Used     Consult Status      [name] 08/23/2016, 3:28 PM

## 2016-08-23 NOTE — Lactation Note (Signed)
This note was copied from a baby's chart. Lactation Consultation Note  Patient Name: Alicia Murphy [id number] Date: 08/23/2016 Reason for consult: Follow-up assessment;Breast/nipple pain;Difficult latch;Hyperbilirubinemia Mom tearful today, feeling very overwhelmed, baby having to stay another night for photo therapy. Mom had given bottle when I arrived to help with feeding and reports she and FOB are going home for few hours once other family members get her to stay with baby. Mom plans to keep working on BF but right now feels like she needs to get some rest at home and some fresh air out of hospital since they have been here 6+ days. Advised Mom to let [facility] know when ready to return baby to breast for assistance with latch while under photo therapy. In the mean time advised Mom to pump every 3 hours for 15 minutes to encourage milk production. Continue to supplement baby, discussed supplemental guidelines w/BF vs only pump/bottle feeding. Mom plans to rent 2 week pump rental before d/c, wants OP f/u before d/c to help with latch. Will await Mom's call for assist.    Maternal Data    Feeding Feeding Type: Breast Fed Length of feed: 20 min  LATCH Score/Interventions                      Lactation Tools Discussed/Used Tools: Nipple Shields;Pump;36F feeding tube / Syringe;Comfort gels Nipple shield size: 20;24 Breast pump type: Double-Electric Breast Pump   Consult Status Consult Status: Follow-up Date: 08/23/16 Follow-up type: In-patient    [name] 08/23/2016, 10:55 AM

## 2016-08-24 ENCOUNTER — Ambulatory Visit: Payer: Self-pay

## 2016-08-24 NOTE — Lactation Note (Addendum)
This note was copied from a baby's chart. Lactation Consultation Note  Patient Name: Alicia Murphy [id number] Date: 08/24/2016 Reason for consult: Follow-up assessment;Hyperbilirubinemia  Baby 29 hours old. Mom reports that her milk is not coming to volume yet, but she is still seeing some colostrum flowing. Parents report that baby's bilirubin levels are much lower today. Offered to assist with latching the baby, but mom declined and reports her nipples are still sore. Mom states that she thinks the baby was latching fine, it was just that the baby was hungry and she did not have any milk flow. Mom reports that NS did not work at all. She intends to continue latching the baby directly to breasts. Enc mom to use EBM and comfort gels for nipples. Discussed sleepiness of baby d/t hyperbilirubinemia.  Mom given 2-week DEBP rental and mom has a follow-up [facility] OP appointment for Friday, 08/28/16. Enc mom to put baby to breast with cues, then supplement with EBM/formula, and then post-pump using DEBP. Enc mom to take pump kit home with her to use with DEBP. Discussed not using Alimentum if baby not getting breast milk. Mom aware of OP/[facility]FS[facility] and [place] phone line assistance after D/C.   Maternal Data    Feeding Feeding Type: Bottle Fed - Formula  LATCH Score/Interventions                      Lactation Tools Discussed/Used     Consult Status Consult Status: PRN    Andres Labrum 08/24/2016, 9:27 AM

## 2016-08-25 NOTE — Progress Notes (Signed)
Post discharge chart review completed.  

## 2016-08-28 ENCOUNTER — Ambulatory Visit (HOSPITAL_COMMUNITY): Admission: RE | Admit: 2016-08-28 | Payer: BLUE CROSS/BLUE SHIELD | Source: Ambulatory Visit

## 2019-05-09 ENCOUNTER — Other Ambulatory Visit: Payer: Self-pay | Admitting: Registered"

## 2019-05-09 DIAGNOSIS — Z20822 Contact with and (suspected) exposure to covid-19: Secondary | ICD-10-CM

## 2019-05-10 LAB — NOVEL CORONAVIRUS, NAA: SARS-CoV-2, NAA: NOT DETECTED

## 2019-12-27 DIAGNOSIS — Z130 Encounter for screening for diseases of the blood and blood-forming organs and certain disorders involving the immune mechanism: Secondary | ICD-10-CM | POA: Diagnosis not present

## 2019-12-27 DIAGNOSIS — R5383 Other fatigue: Secondary | ICD-10-CM | POA: Diagnosis not present

## 2019-12-27 DIAGNOSIS — K219 Gastro-esophageal reflux disease without esophagitis: Secondary | ICD-10-CM | POA: Diagnosis not present

## 2019-12-27 DIAGNOSIS — Z1329 Encounter for screening for other suspected endocrine disorder: Secondary | ICD-10-CM | POA: Diagnosis not present

## 2019-12-27 DIAGNOSIS — Z1321 Encounter for screening for nutritional disorder: Secondary | ICD-10-CM | POA: Diagnosis not present

## 2019-12-27 DIAGNOSIS — Z13228 Encounter for screening for other metabolic disorders: Secondary | ICD-10-CM | POA: Diagnosis not present

## 2019-12-27 DIAGNOSIS — R5381 Other malaise: Secondary | ICD-10-CM | POA: Diagnosis not present

## 2019-12-27 DIAGNOSIS — Z87898 Personal history of other specified conditions: Secondary | ICD-10-CM | POA: Diagnosis not present

## 2019-12-27 DIAGNOSIS — R232 Flushing: Secondary | ICD-10-CM | POA: Diagnosis not present

## 2020-01-04 DIAGNOSIS — D485 Neoplasm of uncertain behavior of skin: Secondary | ICD-10-CM | POA: Diagnosis not present

## 2020-01-04 DIAGNOSIS — L821 Other seborrheic keratosis: Secondary | ICD-10-CM | POA: Diagnosis not present

## 2020-01-04 DIAGNOSIS — D225 Melanocytic nevi of trunk: Secondary | ICD-10-CM | POA: Diagnosis not present

## 2020-01-04 DIAGNOSIS — L814 Other melanin hyperpigmentation: Secondary | ICD-10-CM | POA: Diagnosis not present

## 2020-01-04 DIAGNOSIS — D1801 Hemangioma of skin and subcutaneous tissue: Secondary | ICD-10-CM | POA: Diagnosis not present

## 2020-01-17 DIAGNOSIS — R768 Other specified abnormal immunological findings in serum: Secondary | ICD-10-CM | POA: Diagnosis not present

## 2020-04-10 DIAGNOSIS — T753XXA Motion sickness, initial encounter: Secondary | ICD-10-CM | POA: Diagnosis not present

## 2020-04-10 DIAGNOSIS — Z6830 Body mass index (BMI) 30.0-30.9, adult: Secondary | ICD-10-CM | POA: Diagnosis not present

## 2020-04-10 DIAGNOSIS — R232 Flushing: Secondary | ICD-10-CM | POA: Diagnosis not present

## 2020-05-13 DIAGNOSIS — Z01419 Encounter for gynecological examination (general) (routine) without abnormal findings: Secondary | ICD-10-CM | POA: Diagnosis not present

## 2020-05-13 DIAGNOSIS — R61 Generalized hyperhidrosis: Secondary | ICD-10-CM | POA: Diagnosis not present

## 2020-11-14 ENCOUNTER — Ambulatory Visit: Payer: Self-pay | Admitting: Allergy and Immunology

## 2020-12-23 ENCOUNTER — Encounter: Payer: Self-pay | Admitting: Allergy and Immunology

## 2020-12-23 ENCOUNTER — Other Ambulatory Visit: Payer: Self-pay

## 2020-12-23 ENCOUNTER — Ambulatory Visit: Payer: 59 | Admitting: Allergy and Immunology

## 2020-12-23 VITALS — BP 122/72 | HR 100 | Resp 16 | Ht 66.0 in | Wt 204.0 lb

## 2020-12-23 DIAGNOSIS — B338 Other specified viral diseases: Secondary | ICD-10-CM

## 2020-12-23 DIAGNOSIS — D229 Melanocytic nevi, unspecified: Secondary | ICD-10-CM

## 2020-12-23 DIAGNOSIS — L500 Allergic urticaria: Secondary | ICD-10-CM | POA: Diagnosis not present

## 2020-12-23 DIAGNOSIS — J301 Allergic rhinitis due to pollen: Secondary | ICD-10-CM | POA: Diagnosis not present

## 2020-12-23 DIAGNOSIS — J3089 Other allergic rhinitis: Secondary | ICD-10-CM | POA: Diagnosis not present

## 2020-12-23 DIAGNOSIS — T783XXD Angioneurotic edema, subsequent encounter: Secondary | ICD-10-CM

## 2020-12-23 DIAGNOSIS — T7840XD Allergy, unspecified, subsequent encounter: Secondary | ICD-10-CM

## 2020-12-23 MED ORDER — AUVI-Q 0.3 MG/0.3ML IJ SOAJ: INTRAMUSCULAR | 3 refills | Status: AC

## 2020-12-23 MED ORDER — MONTELUKAST SODIUM 10 MG PO TABS: 10.0000 mg | ORAL_TABLET | Freq: Every day | ORAL | 5 refills | Status: DC

## 2020-12-23 NOTE — Progress Notes (Signed)
[facility] [place] [facility] [place] [facility] [place] [facility] [place]   Dear Dr. Lin Landsman,  Thank you for referring Alicia Murphy to the [facility] on 12/23/2020.   Below is a summation of this patient's evaluation and recommendations.  Thank you for your referral. I will keep you informed about this patient's response to treatment.   If you have any questions please do not hesitate to contact me.   Sincerely,  Jiles Prows, MD Allergy / Immunology [place]   ______________________________________________________________________    NEW PATIENT NOTE  Referring Provider: Angelina Sheriff, MD Primary Provider: Elenore Paddy, NP Date of office visit: 12/23/2020    Subjective:   Chief Complaint:  Alicia Murphy (DOB: 1985-12-15) is a 35 y.o. female who presents to the clinic on 12/23/2020 with a chief complaint of Urticaria and Allergic Rhinitis  .     HPI: Alicia Murphy presents to this clinic in evaluation of hives.  Over the course of the past 6 months or so she started developing red raised itchy lesions mostly across her trunk that were intensely itchy that would wax and wane over several days and never leave behind any scar or hyperpigmentation sometimes associated with lip and face swelling.  She has had 3 events with her first event treated with Kenalog and her other events treated with antihistamines.  She always takes an Human resources officer or Zyrtec every day and needs to add in Benadryl when she develops her hives.  There is no obvious provoking factor giving rise to this issue.  She does have a history of allergic disease with sneezing and nose blowing and some coughing occurring on a perennial basis with springtime exacerbation and this spring has been the worst.  She does have a dog inside the household for the past year. She is allergic to the dog for if the dog licks her or scratches her she gets a  contact hive.  She has also noticed that she has had a lot of sweating since January 2021.  It involves mostly her trunk.  It appears to be a daily issue and she also has night sweats and hot flashes.  She believes that she is entering into menopause.  She has been given some venlafaxine for this issue.  Apparently she has had some blood test performed in investigation of her complaints which have all been normal.  She has seen a rheumatologist for an elevated rheumatoid factor which apparently was a nondiagnostic work-up.  She does visit with Dr. Jimmye Norman on a yearly basis for recurrent moles.  She has gained 70 pounds of weight over the course of the past decade or so.  Past Medical History:  Diagnosis Date  . Asthma   . HSV infection   . Urticaria   . Vaginal Pap smear, abnormal    LEEP    Past Surgical History:  Procedure Laterality Date  . LEEP    . oral surgery  2003   [place] teeth extraction     Allergies as of 12/23/2020   No Known Allergies     Medication List      fexofenadine 180 MG tablet Commonly known as: ALLEGRA Take 180 mg by mouth daily.   pantoprazole 40 MG tablet Commonly known as: PROTONIX Take 1 tablet by mouth daily.   venlafaxine XR 75 MG 24 hr capsule Commonly known as: EFFEXOR-XR Take 75 mg by mouth daily.  Review of systems negative except as noted in HPI / PMHx or noted below:  Review of Systems  Constitutional: Negative.   HENT: Negative.   Eyes: Negative.   Respiratory: Negative.   Cardiovascular: Negative.   Gastrointestinal: Negative.   Genitourinary: Negative.   Musculoskeletal: Negative.   Skin: Negative.   Neurological: Negative.   Endo/Heme/Allergies: Negative.   Psychiatric/Behavioral: Negative.     History reviewed. No pertinent family history.  Social History   Socioeconomic History  . Marital status: Single    Spouse name: Not on file  . Number of children: Not on file  . Years of education: Not on file   . Highest education level: Not on file  Occupational History  . Not on file  Tobacco Use  . Smoking status: Former Research scientist (life sciences)  . Smokeless tobacco: Never Used  Substance and Sexual Activity  . Alcohol use: No  . Drug use: No  . Sexual activity: Yes    Birth control/protection: None  Other Topics Concern  . Not on file  Social History Narrative  . Not on file   Environmental and Social history  Lives in a house with a dry environment, cats and dogs located inside the household, carpet in the bedroom, plastic on the bed, no plastic on the pillow, no smoking ongoing with inside the household.  Objective:   Vitals:   12/23/20 0949  BP: 122/72  Pulse: 100  Resp: 16  SpO2: 98%   Height: 5\' 6"  (167.6 cm) Weight: 204 lb (92.5 kg)  Physical Exam Constitutional:      Appearance: She is not diaphoretic.  HENT:     Head: Normocephalic. No right periorbital erythema or left periorbital erythema.     Right Ear: Tympanic membrane, ear canal and external ear normal.     Left Ear: Tympanic membrane, ear canal and external ear normal.     Nose: Nose normal. No mucosal edema or rhinorrhea.     Mouth/Throat:     Pharynx: Uvula midline. No oropharyngeal exudate.  Eyes:     General: Lids are normal.     Conjunctiva/sclera: Conjunctivae normal.     Pupils: Pupils are equal, round, and reactive to light.  Neck:     Thyroid: No thyromegaly.     Trachea: Trachea normal. No tracheal tenderness or tracheal deviation.  Cardiovascular:     Rate and Rhythm: Normal rate and regular rhythm.     Heart sounds: Normal heart sounds, S1 normal and S2 normal. No murmur heard.   Pulmonary:     Effort: Pulmonary effort is normal. No respiratory distress.     Breath sounds: Normal breath sounds. No stridor. No wheezing or rales.  Chest:     Chest wall: No tenderness.  Abdominal:     General: There is no distension.     Palpations: Abdomen is soft. There is no mass.     Tenderness: There is no  abdominal tenderness. There is no guarding or rebound.  Musculoskeletal:        General: No tenderness.  Lymphadenopathy:     Head:     Right side of head: No tonsillar adenopathy.     Left side of head: No tonsillar adenopathy.     Cervical: No cervical adenopathy.  Skin:    Coloration: Skin is not pale.     Findings: Rash (Multiple melanotic skin lesions across trunk and extremities.) present. No erythema.     Nails: There is no clubbing.  Neurological:  Mental Status: She is alert.     Diagnostics: Allergy skin tests were performed.  She demonstrated hypersensitivity to trees, grasses, weeds, house dust mite, cat, dog.  She also had very slight hypersensitivity against sesame, almond, hazelnut, oat  Results of blood tests obtained 17 Dec 2019 identified WBC 6.0, hemoglobin 13.4, platelet 271, creatinine 0.9 mg/DL, AST 22U/L, ALT 28U/L, globulin 2.6G/DL, TSH 2.085 IU/mL, free T4 0.90 NG/mL, RA latex 30.1U/mL, sed rate 2, negative Lyme screen   Assessment and Plan:    1. Allergic urticaria   2. Angioedema, subsequent encounter   3. Allergic reaction, subsequent encounter   4. Perennial allergic rhinitis   5. Seasonal allergic rhinitis due to pollen   6. Sweating disease   7. Numerous skin moles     1.  Allergen avoidance measures - dust mite, pollens, animals (foods???)  2.  Cetirizine 10 mg - 1-2 tablets 1-2 times per day (MAX=40mg /day)  3. Montelukast 10 mg - 1 tablet 1 time per day  4. Budesonide or Nasacort - 1-2 sprays each nostril 3-7 times per week. Takes days to work  5. Can add benadryl if needed  6. Can add Auvi-Q 0.3, benadryl, MD/ER evaluation for allergic reaction  7. Blood - alpha-gal panel, nut panel w/R, Thyroid Peroxidase, Tryptase. Sesame IgE, Oat IgE  8. 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  We are going to work through the possibility of food allergy with the blood test noted above and given the fact that she has such a hyperactive immune system and has a cutaneous presentation of multiple melanotic lesions across her skin were going to check a tryptase level for mast cell overactivity.  She can utilize a collection of agents directed against respiratory tract inflammation as noted above.  I will see her back in his clinic in 4 weeks or earlier if there is a problem and I will contact her with the results of her blood test once they are available for review.  She has a sweating disorder and at this point in time we are not going to pursue any further evaluation for that issue with the assumption that she is heading into a perimenopausal condition.  Jiles Prows, MD Allergy / Immunology [facility]

## 2020-12-23 NOTE — Patient Instructions (Addendum)
  1.  Allergen avoidance measures - dust mite, pollens, animals (foods???)  2.  Cetirizine 10 mg - 1-2 tablets 1-2 times per day (MAX=40mg /day)  3. Montelukast 10 mg - 1 tablet 1 time per day  4. Budesonide or Nasacort - 1-2 sprays each nostril 3-7 times per week. Takes days to work  5. Can add benadryl if needed  6. Can add Auvi-Q 0.3, benadryl, MD/ER evaluation for allergic reaction  7. Blood - alpha-gal panel, nut panel w/R, Thyroid Peroxidase, Tryptase. Sesame IgE, Oat IgE  8. Return to clinic in 4 weeks or earlier if problem

## 2020-12-24 ENCOUNTER — Encounter: Payer: Self-pay | Admitting: Allergy and Immunology

## 2020-12-24 ENCOUNTER — Encounter: Payer: Self-pay | Admitting: *Deleted

## 2020-12-24 ENCOUNTER — Telehealth: Payer: Self-pay | Admitting: Allergy and Immunology

## 2020-12-24 LAB — IGE NUT PROF. W/COMPONENT RFLX

## 2020-12-24 LAB — ALPHA-GAL PANEL

## 2020-12-24 NOTE — Telephone Encounter (Signed)
Patient called to talk to nurse about the lab results that [facility] had sent her [phone].

## 2020-12-24 NOTE — Telephone Encounter (Signed)
Patient was informed that we are waiting on results and also for them to be reviewed by Dr. Neldon Mc. As soon as we get the results note we will definitely give her a call with that information

## 2020-12-25 LAB — IGE NUT PROF. W/COMPONENT RFLX

## 2020-12-25 LAB — ALPHA-GAL PANEL

## 2020-12-30 LAB — ALPHA-GAL PANEL
Allergen Lamb IgE: 0.27 kU/L — AB
Beef IgE: <0.1 kU/L
O215-IgE Alpha-Gal: <0.1 kU/L

## 2020-12-30 LAB — TRYPTASE: Tryptase: 1.9 ug/L — ABNORMAL LOW (ref 2.2–13.2)

## 2020-12-30 LAB — PEANUT COMPONENTS
F352-IgE Ara h 8: 1.26 kU/L — AB
F422-IgE Ara h 1: <0.1 kU/L
F423-IgE Ara h 2: <0.1 kU/L
F424-IgE Ara h 3: <0.1 kU/L
F427-IgE Ara h 9: <0.1 kU/L
F447-IgE Ara h 6: <0.1 kU/L

## 2020-12-30 LAB — IGE NUT PROF. W/COMPONENT RFLX
F017-IgE Hazelnut (Filbert): 14.2 kU/L — AB
F202-IgE Cashew Nut: <0.1 kU/L
F256-IgE Walnut: <0.1 kU/L
Macadamia Nut, IgE: 0.43 kU/L — AB
Pecan Nut IgE: <0.1 kU/L

## 2020-12-30 LAB — PANEL 604726
Cor A 1 IgE: 20.9 kU/L — AB
Cor A 14 IgE: <0.1 kU/L
Cor A 8 IgE: <0.1 kU/L
Cor A 9 IgE: <0.1 kU/L

## 2020-12-30 LAB — ALLERGEN COMPONENT COMMENTS

## 2020-12-30 LAB — ALLERGEN SESAME F10: Sesame Seed IgE: 0.13 kU/L — AB

## 2020-12-30 LAB — ALLERGEN,OAT,F7: Allergen Oat IgE: 0.28 kU/L — AB

## 2020-12-30 LAB — THYROID PEROXIDASE ANTIBODY: Thyroperoxidase Ab SerPl-aCnc: <8 IU/mL (ref 0–34)

## 2021-01-20 ENCOUNTER — Ambulatory Visit: Payer: 59 | Admitting: Allergy and Immunology

## 2021-01-20 ENCOUNTER — Encounter: Payer: Self-pay | Admitting: Allergy and Immunology

## 2021-01-20 ENCOUNTER — Other Ambulatory Visit: Payer: Self-pay

## 2021-01-20 VITALS — BP 128/88 | HR 88 | Resp 16

## 2021-01-20 DIAGNOSIS — J3089 Other allergic rhinitis: Secondary | ICD-10-CM | POA: Diagnosis not present

## 2021-01-20 DIAGNOSIS — L500 Allergic urticaria: Secondary | ICD-10-CM | POA: Diagnosis not present

## 2021-01-20 DIAGNOSIS — B338 Other specified viral diseases: Secondary | ICD-10-CM

## 2021-01-20 DIAGNOSIS — T7840XD Allergy, unspecified, subsequent encounter: Secondary | ICD-10-CM

## 2021-01-20 DIAGNOSIS — J301 Allergic rhinitis due to pollen: Secondary | ICD-10-CM

## 2021-01-20 NOTE — Progress Notes (Signed)
[place]   Follow-up Note  Referring Provider: Elenore Paddy, NP Primary Provider: Elenore Paddy, NP Date of Office Visit: 01/20/2021  Subjective:   Alicia Murphy (DOB: Oct 22, 1985) is a 35 y.o. female who returns to the [facility] on 01/20/2021 in re-evaluation of the following:  HPI: Alicia Murphy returns to this clinic in evaluation of urticaria and allergic reaction, allergic rhinoconjunctivitis, and a sweating disorder.  I last saw her in this clinic during her initial evaluation of 23 Dec 2020.  Since her last visit she has had no hives and no allergic reactions.  She continues on montelukast and Allegra.  Since her last visit she has had significant improvement regarding her nasal and eye issues.  The animals are no longer on the bed.  She has covered her pillow in a dust mite free cover and is using the dryer on all bedding and has a HEPA filter.  She has been using her nasal steroid intermittently.  She is not avoiding any foods.  She eats a large selection of foods without restriction but she has not consumed any hazelnut.  She still continues to have night sweats.  She was started on venlafaxine but still appears to have an issue.  She is going to be starting a new type of diet today and she anticipates that her night sweats will be much better where she participates in his diet.  Allergies as of 01/20/2021   No Known Allergies      Medication List      Auvi-Q 0.3 mg/0.3 mL Soaj injection Generic drug: EPINEPHrine Use as directed for life-threatening allergic reaction.   fexofenadine 180 MG tablet Commonly known as: ALLEGRA Take 180 mg by mouth daily.   montelukast 10 MG tablet Commonly known as: SINGULAIR Take 1 tablet (10 mg total) by mouth at bedtime.   ONE DAILY MULTIVITAMIN WOMEN PO Take by mouth.   pantoprazole 40 MG tablet Commonly known as: PROTONIX Take 1 tablet by mouth daily.    venlafaxine XR 75 MG 24 hr capsule Commonly known as: EFFEXOR-XR Take 75 mg by mouth daily.        Past Medical History:  Diagnosis Date   Asthma    HSV infection    Urticaria    Vaginal Pap smear, abnormal    LEEP    Past Surgical History:  Procedure Laterality Date   LEEP     oral surgery  2003   [place] teeth extraction     Review of systems negative except as noted in HPI / PMHx or noted below:  Review of Systems  Constitutional: Negative.   HENT: Negative.    Eyes: Negative.   Respiratory: Negative.    Cardiovascular: Negative.   Gastrointestinal: Negative.   Genitourinary: Negative.   Musculoskeletal: Negative.   Skin: Negative.   Neurological: Negative.   Endo/Heme/Allergies: Negative.   Psychiatric/Behavioral: Negative.      Objective:   Vitals:   01/20/21 1028  BP: 128/88  Pulse: 88  Resp: 16  SpO2: 97%          Physical Exam Constitutional:      Appearance: She is not diaphoretic.  HENT:     Head: Normocephalic.     Right Ear: Tympanic membrane, ear canal and external ear normal.     Left Ear: Tympanic membrane, ear canal and external ear normal.     Nose: Nose normal. No mucosal edema or rhinorrhea.  Mouth/Throat:     Pharynx: Uvula midline. No oropharyngeal exudate.  Eyes:     Conjunctiva/sclera: Conjunctivae normal.  Neck:     Thyroid: No thyromegaly.     Trachea: Trachea normal. No tracheal tenderness or tracheal deviation.  Cardiovascular:     Rate and Rhythm: Normal rate and regular rhythm.     Heart sounds: Normal heart sounds, S1 normal and S2 normal. No murmur heard. Pulmonary:     Effort: No respiratory distress.     Breath sounds: Normal breath sounds. No stridor. No wheezing or rales.  Lymphadenopathy:     Head:     Right side of head: No tonsillar adenopathy.     Left side of head: No tonsillar adenopathy.     Cervical: No cervical adenopathy.  Skin:    Findings: No erythema or rash.     Nails: There is no  clubbing.  Neurological:     Mental Status: She is alert.    Diagnostics:   Results of blood tests obtained 23 Dec 2020 identified tryptase 1.9 UG/L, IgE directed against pork 0.93 KU/L, lamb 0.27 KU/L, beef less than 0.10 KU/L, alpha gal less than 0.10 KU/L, hazelnut 14.20 KU/L, peanut 0.38 KU/L, macadamia 0.43 KU/L, pistachio 0.20 KU/L, almond 0.89 KU/L, sesame seed 0.13 KU/L, oat 0.28 KU/L, cor A1 20.90 KU/L, Ara H8 1.26 KU/L, thyroid peroxidase antibody less than 8 EU/mL.  Assessment and Plan:   1. Allergic urticaria   2. Allergic reaction, subsequent encounter   3. Perennial allergic rhinitis   4. Seasonal allergic rhinitis due to pollen   5. Sweating disease     1. Allergen avoidance measures - dust mite, pollens, animals, hazelnut  2.  Continue fexofenadine 180 -1 tablet 1 time per day  3.  Continue montelukast 10 mg - 1 tablet 1 time per day  4.  Continue Nasacort - 1-2 sprays each nostril 1-7 times per week.    5. Can add benadryl if needed  6. Can add Auvi-Q 0.3, benadryl, MD/ER evaluation for allergic reaction  7. Return to clinic in 6 months or earlier if problem  Alicia Murphy is doing very well at this time of the year on her current plan as noted above.  Hopefully she will continue to do well as she goes through the rest of the year and especially next spring.  I think that pollen is driving a lot of her immune reaction.  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  We are not going to have her undergo any restriction for consumption of foods other than the fact that I did recommend that she not eat any hazelnut.  She will keep in contact with me noting her response to the approach noted above.  If she does well I will see her back in this clinic in 6 months or earlier if there is a problem.  She will need to follow-up with her primary care doctor regarding any further evaluation for her sweating disorder.  Allena Katz, MD [facility]  / [facility] [place]

## 2021-01-20 NOTE — Patient Instructions (Addendum)
  1. Allergen avoidance measures - dust mite, pollens, animals, hazelnut  2.  Continue fexofenadine 180 -1 tablet 1 time per day  3.  Continue montelukast 10 mg - 1 tablet 1 time per day  4.  Continue Nasacort - 1-2 sprays each nostril 1-7 times per week.    5. Can add benadryl if needed  6. Can add Auvi-Q 0.3, benadryl, MD/ER evaluation for allergic reaction  7. Return to clinic in 6 months or earlier if problem

## 2021-01-21 ENCOUNTER — Encounter: Payer: Self-pay | Admitting: Allergy and Immunology

## 2021-06-17 ENCOUNTER — Other Ambulatory Visit: Payer: Self-pay | Admitting: Allergy and Immunology
